# Patient Record
Sex: Male | Born: 2000 | Race: Black or African American | Hispanic: No | Marital: Single | Smoking: Never smoker
Health system: Southern US, Community
[De-identification: ages and names within clinical notes are randomized; demographics above are authoritative.]

---
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
## 2022-01-21 IMAGING — CR DG CHEST 2V
2 series · 2 of 2 positions shown · non-contrast
Comparison: Chest radiograph dated 04/26/2003

CLINICAL DATA: 18-year-old male with shortness of breath.

EXAM:
CHEST - 2 VIEW

[w chest pa]
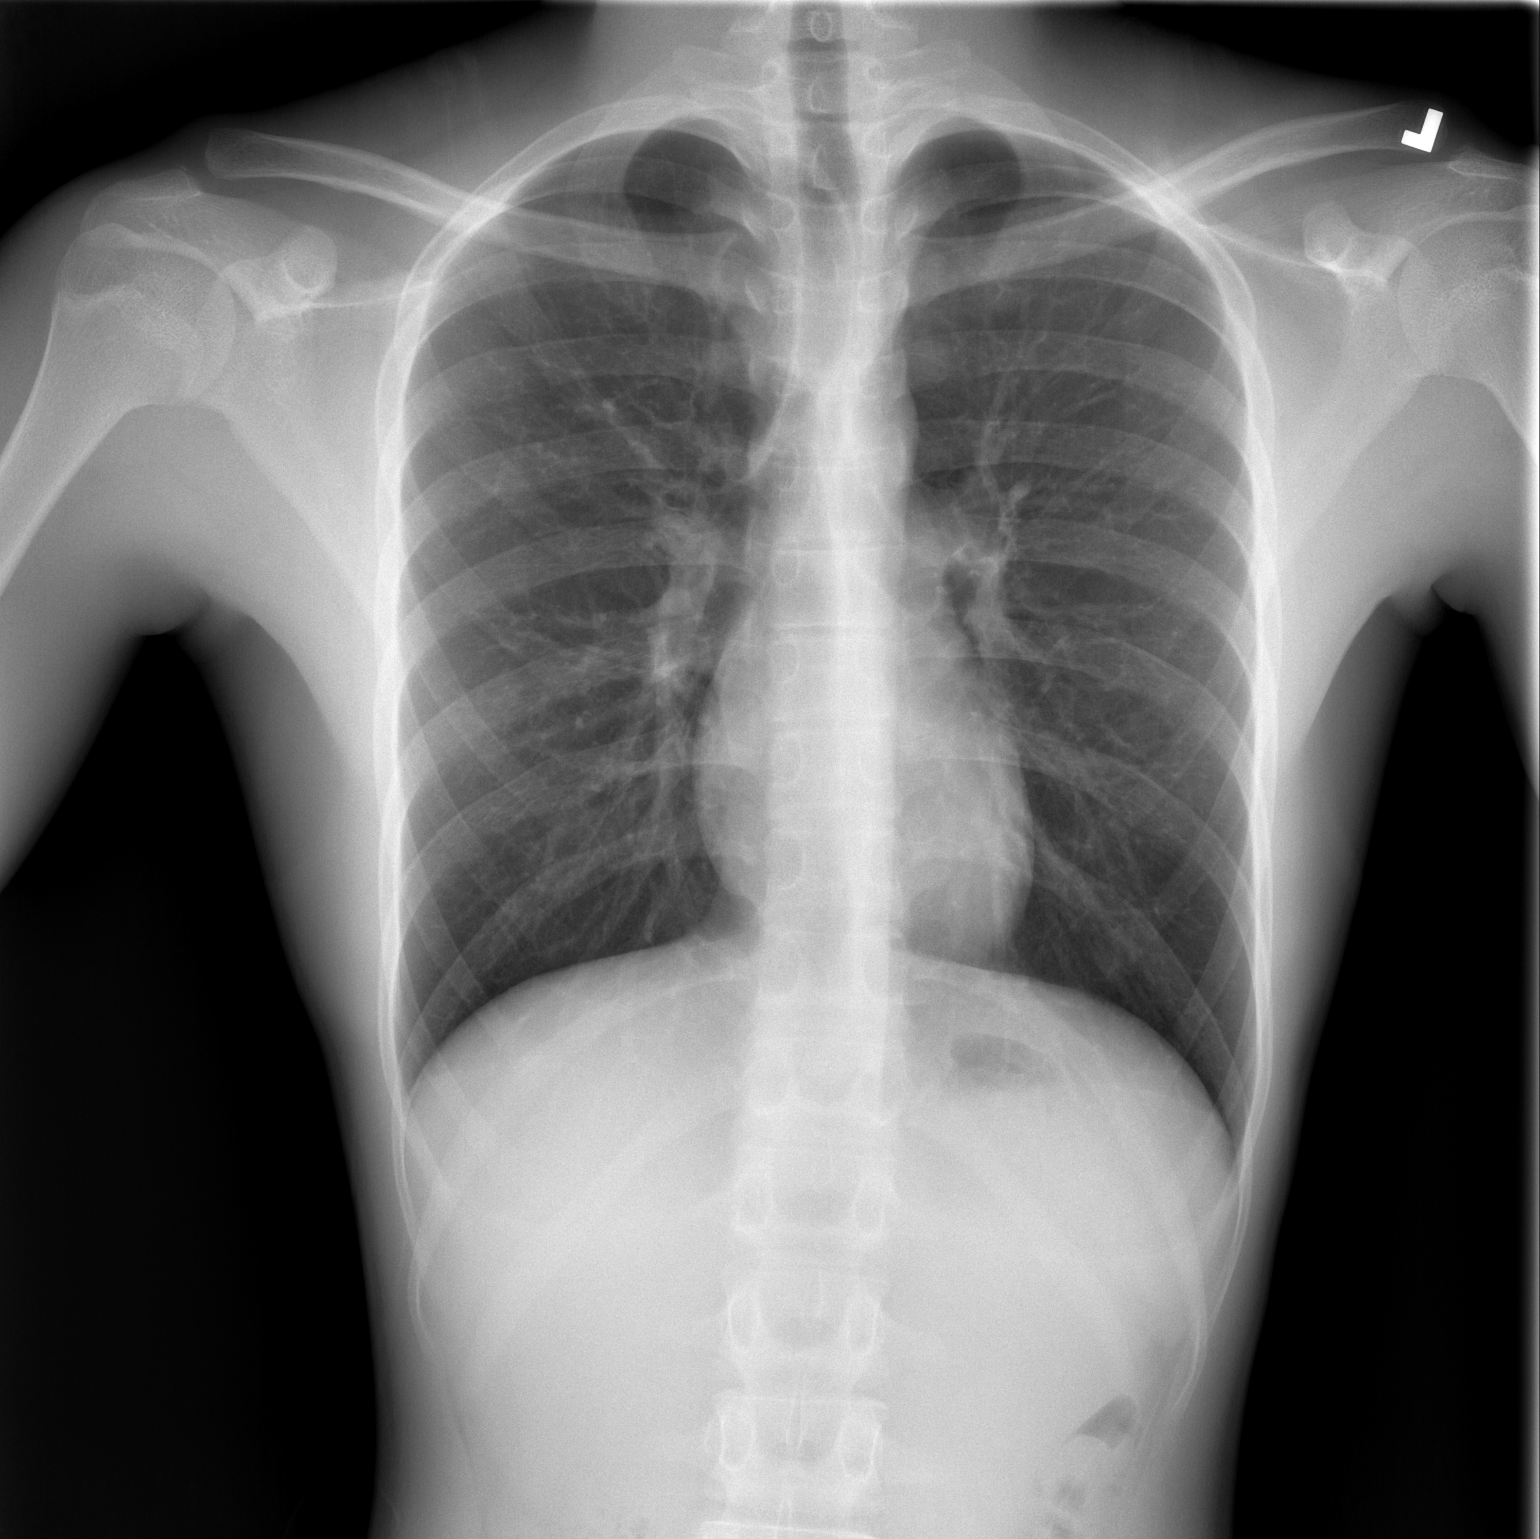

[w chest lat]
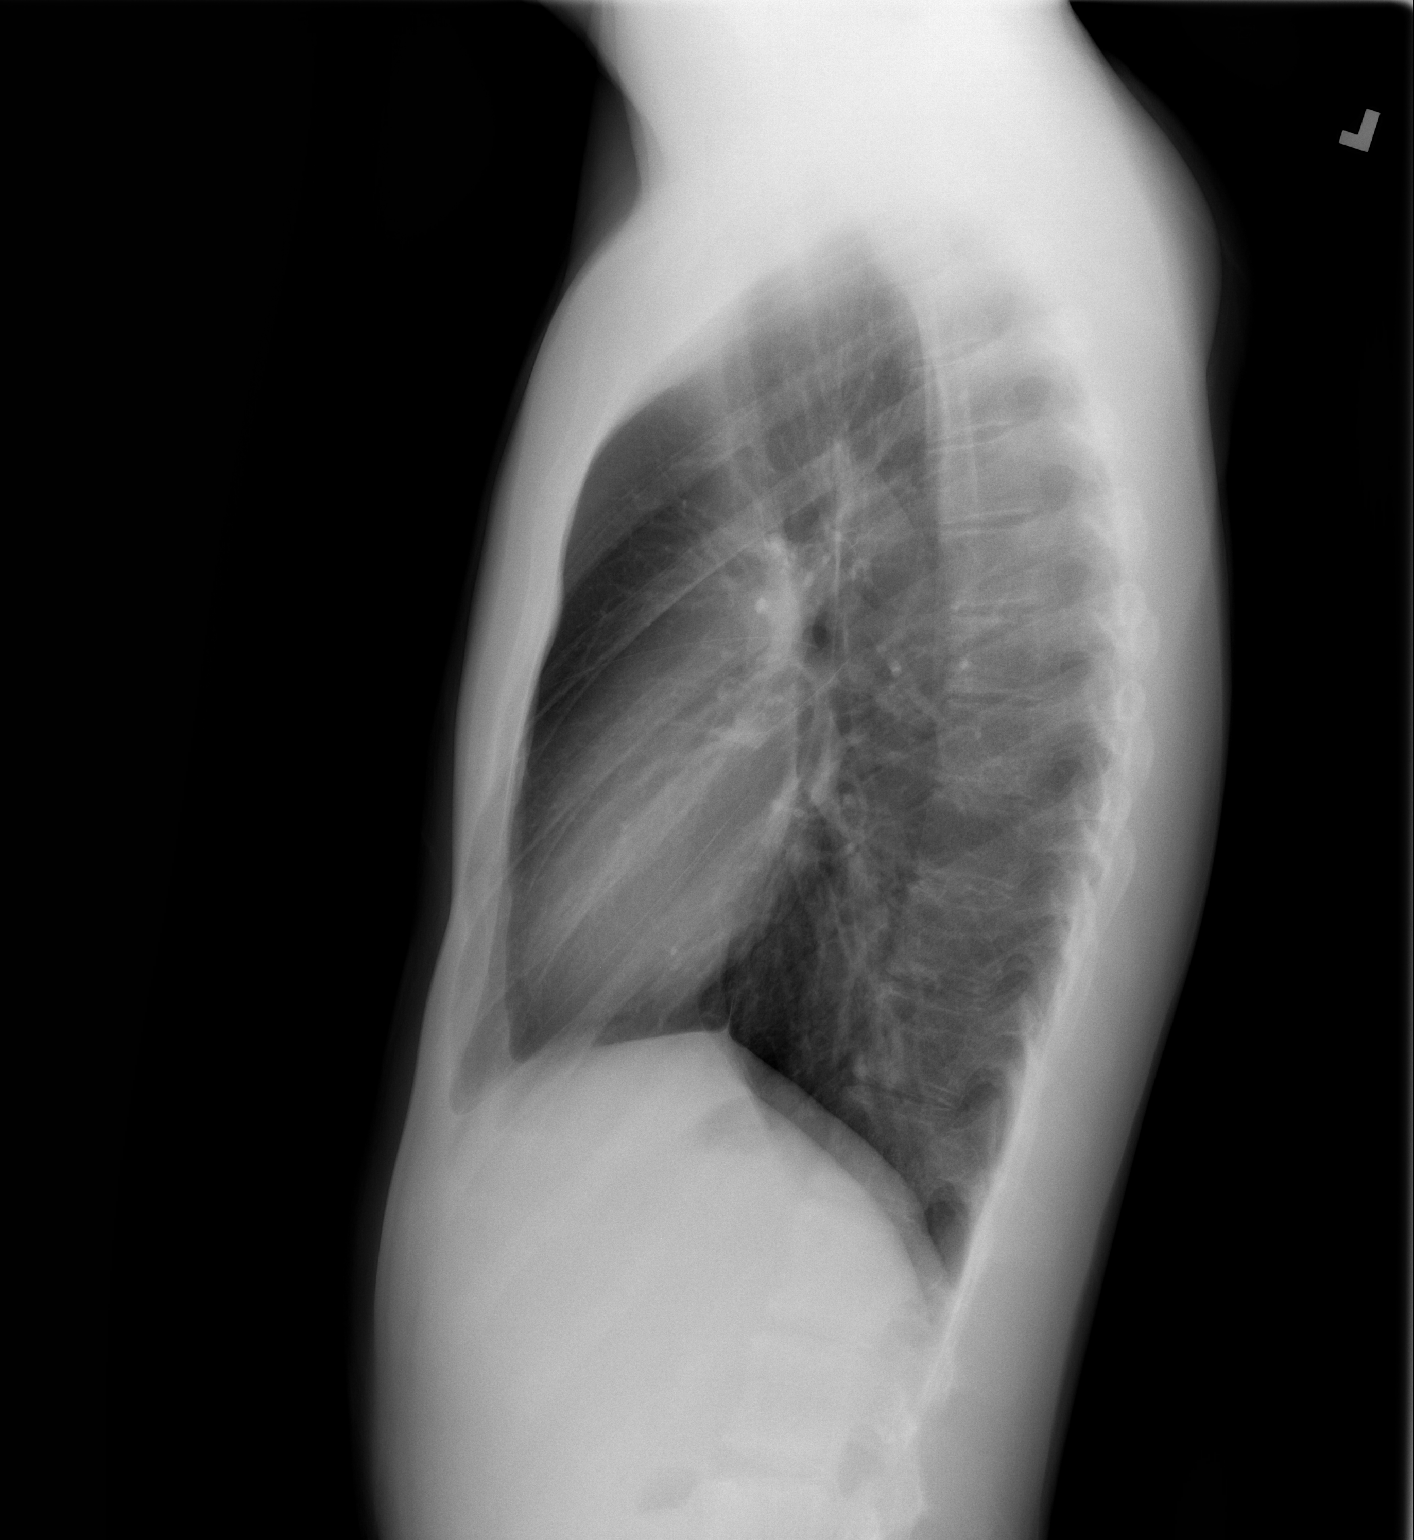

[2 of 2 positions shown; findings below may reference images not displayed]

FINDINGS: The heart size and mediastinal contours are within normal limits.
Both lungs are clear. The visualized skeletal structures are
unremarkable.
IMPRESSION: No active cardiopulmonary disease.

## 2024-02-16 ENCOUNTER — Ambulatory Visit
Admission: EM | Admit: 2024-02-16 | Discharge: 2024-02-16 | Disposition: A | Discharge status: Home / Self Care | Attending: Family Medicine | Admitting: Family Medicine

## 2024-02-16 DIAGNOSIS — R519 Headache, unspecified: Secondary | ICD-10-CM

## 2024-02-16 DIAGNOSIS — B349 Viral infection, unspecified: Secondary | ICD-10-CM | POA: Diagnosis not present

## 2024-02-16 LAB — POC COVID19/FLU A&B COMBO
Covid Antigen, POC: NEGATIVE
Influenza A Antigen, POC: NEGATIVE
Influenza B Antigen, POC: NEGATIVE

## 2024-02-16 MED ORDER — NAPROXEN 500 MG PO TABS: 500.0000 mg | ORAL_TABLET | Freq: Two times a day (BID) | ORAL | 0 refills | Status: AC | PRN

## 2024-02-16 NOTE — ED Provider Notes (Signed)
 " [facility]    CSN: [id number] Arrival date & time: 02/16/24  1517      History   Chief Complaint Chief Complaint  Patient presents with   Headache    HPI Jason Zamora is a 24 y.o. male  presents for evaluation of URI symptoms for 4 days. Patient reports associated symptoms of headache.  Rates headache as a 4 out of 10 and denies worst headache of life.  No thunderclap headache.  States he had cough congestion, body aches chills and sore throat but that has since resolved.  Denies N/V/D, ear pain or shortness of breath. Patient does not have a hx of asthma. Patient is not an active smoker.   Reports no known sick contacts.  Pt has taken ibuprofen , Tylenol, TheraFlu OTC for symptoms.  Patient is requesting COVID and flu testing.  Pt has no other concerns at this time.    Headache   History reviewed. No pertinent past medical history.  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 No pertinent surgical history.     Home Medications    Prior to Admission medications  Medication Sig Start Date End Date Taking? Authorizing Provider  naproxen  (NAPROSYN ) 500 MG tablet Take 1 tablet (500 mg total) by mouth 2 (two) times daily as needed for headache. 02/16/24  Yes Winthrop Shannahan, Jodi R, NP  dexmethylphenidate  (FOCALIN ) 5 MG tablet Take 1 tablet in the morning before school, 1 tablet before lunch and 1 tablet after coming home from school 03/17/17   Susen Elsie DEL, MD  ondansetron  (ZOFRAN  ODT) 4 MG disintegrating tablet Take 1 tablet (4 mg total) by mouth every 8 (eight) hours as needed for nausea or vomiting. Patient not taking: Reported on 01/01/2017 06/01/13   Catherne Nest, PA-C    Family History Family History  Problem Relation Age of Onset   Migraines Mother    Asthma Brother    Diabetes Maternal Grandmother    Asthma Sister      Social History Social History[1]   Allergies   Patient has no known allergies.   Review of Systems Review of Systems  Neurological:  Positive for headaches.     Physical Exam Triage Vital Signs ED Triage Vitals  Encounter Vitals Group     BP 02/16/24 1555 121/85     Girls Systolic BP Percentile --      Girls Diastolic BP Percentile --      Boys Systolic BP Percentile --      Boys Diastolic BP Percentile --      Pulse Rate 02/16/24 1555 76     Resp 02/16/24 1555 18     Temp 02/16/24 1555 98.8 F (37.1 C)     Temp src --      SpO2 02/16/24 1555 97 %     Weight --      Height --      Head Circumference --      Peak Flow --      Pain Score 02/16/24 1554 0     Pain Loc --      Pain Education --      Exclude from Growth Chart --    No data found.  Updated Vital Signs BP 121/85   Pulse 76   Temp 98.8 F (37.1 C)   Resp 18   SpO2 97%   Visual Acuity  Right Eye Distance:   Left Eye Distance:   Bilateral Distance:    Right Eye Near:   Left Eye Near:    Bilateral Near:     Physical Exam Vitals and nursing note reviewed.  Constitutional:      General: He is not in acute distress.    Appearance: Normal appearance. He is not ill-appearing or toxic-appearing.  HENT:     Head: Normocephalic and atraumatic.     Right Ear: Tympanic membrane and ear canal normal.     Left Ear: Tympanic membrane and ear canal normal.     Nose: No congestion.     Mouth/Throat:     Mouth: Mucous membranes are moist.     Pharynx: No posterior oropharyngeal erythema.  Eyes:     Pupils: Pupils are equal, round, and reactive to light.  Cardiovascular:     Rate and Rhythm: Normal rate and regular rhythm.     Heart sounds: Normal heart sounds.  Pulmonary:     Effort: Pulmonary effort is normal.     Breath sounds: Normal breath sounds.  Musculoskeletal:     Cervical back: Normal range of motion and neck supple.  Lymphadenopathy:     Cervical: No cervical adenopathy.  Skin:     General: Skin is warm and dry.  Neurological:     General: No focal deficit present.     Mental Status: He is alert and oriented to person, place, and time.  Psychiatric:        Mood and Affect: Mood normal.        Behavior: Behavior normal.      UC Treatments / Results  Labs (all labs ordered are listed, but only abnormal results are displayed) Labs Reviewed  POC COVID19/FLU A&B COMBO    EKG   Radiology No results found.  Procedures Procedures (including critical care time)  Medications Ordered in UC Medications - No data to display  Initial Impression / Assessment and Plan / UC Course  I have reviewed the triage vital signs and the nursing notes.  Pertinent labs & imaging results that were available during my care of the patient were reviewed by me and considered in my medical decision making (see chart for details).     Negative COVID and flu testing.  Discussed viral illness and symptomatic treatment.  Rx naproxen  for headaches.  Encourage rest fluids and PCP follow-up if symptoms do not improve.  ER precautions reviewed. Final Clinical Impressions(s) / UC Diagnoses   Final diagnoses:  Acute nonintractable headache, unspecified headache type  Viral illness     Discharge Instructions      You tested negative for COVID and flu.  You may take naproxen  twice daily as needed for headache/body aches. Viral illnesses can last 7-10 days. Please follow up with your PCP if your symptoms are not improving. Please go to the ER for any worsening symptoms. This includes but is not limited to fever you can not control with tylenol or ibuprofen , you are not able to stay hydrated, you have shortness of breath or chest pain.  Thank you for choosing [facility] for your healthcare needs. I hope you feel better soon!      ED Prescriptions     Medication Sig Dispense Auth. Provider   naproxen  (NAPROSYN ) 500 MG tablet Take 1 tablet (500 mg total) by mouth 2 (two) times daily  as needed for headache. 14 tablet Jermiah Howton, Jodi R, NP      PDMP not reviewed  this encounter.     [1]  Social History Tobacco Use   Smoking status: Never   Smokeless tobacco: Never  Substance Use Topics   Alcohol use: Not Currently   Drug use: Not Currently     Loreda Myla SAUNDERS, NP 02/16/24 1639  "

## 2024-02-16 NOTE — Discharge Instructions (Signed)
 You tested negative for COVID and flu.  You may take naproxen  twice daily as needed for headache/body aches. Viral illnesses can last 7-10 days. Please follow up with your PCP if your symptoms are not improving. Please go to the ER for any worsening symptoms. This includes but is not limited to fever you can not control with tylenol or ibuprofen , you are not able to stay hydrated, you have shortness of breath or chest pain.  Thank you for choosing [facility] for your healthcare needs. I hope you feel better soon!

## 2024-02-16 NOTE — ED Triage Notes (Signed)
 Pt present with c/o chills, headaches and sore throat x 4 days. Pt states the symptoms are going away but still has headaches.
